# Patient Record
Sex: Male | Born: 1990 | Race: White | Hispanic: No | Marital: Single
Health system: Southern US, Community
[De-identification: ages and names within clinical notes are randomized; demographics above are authoritative.]

## PROBLEM LIST (undated history)

## (undated) DIAGNOSIS — M109 Gout, unspecified: Secondary | ICD-10-CM

---
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
## 2016-02-02 DIAGNOSIS — G4726 Circadian rhythm sleep disorder, shift work type: Secondary | ICD-10-CM | POA: Diagnosis not present

## 2016-02-02 DIAGNOSIS — F419 Anxiety disorder, unspecified: Secondary | ICD-10-CM | POA: Diagnosis not present

## 2016-02-02 DIAGNOSIS — R040 Epistaxis: Secondary | ICD-10-CM | POA: Diagnosis not present

## 2016-02-20 DIAGNOSIS — K080 Exfoliation of teeth due to systemic causes: Secondary | ICD-10-CM | POA: Diagnosis not present

## 2017-01-23 DIAGNOSIS — G43909 Migraine, unspecified, not intractable, without status migrainosus: Secondary | ICD-10-CM | POA: Diagnosis not present

## 2017-01-23 DIAGNOSIS — G4726 Circadian rhythm sleep disorder, shift work type: Secondary | ICD-10-CM | POA: Diagnosis not present

## 2017-01-27 ENCOUNTER — Emergency Department (HOSPITAL_COMMUNITY)

## 2017-01-27 ENCOUNTER — Emergency Department (HOSPITAL_COMMUNITY)
Admission: EM | Admit: 2017-01-27 | Discharge: 2017-01-28 | Disposition: A | Discharge status: Home / Self Care | Attending: Emergency Medicine | Admitting: Emergency Medicine

## 2017-01-27 ENCOUNTER — Encounter (HOSPITAL_COMMUNITY): Payer: Self-pay | Admitting: Emergency Medicine

## 2017-01-27 DIAGNOSIS — W231XXA Caught, crushed, jammed, or pinched between stationary objects, initial encounter: Secondary | ICD-10-CM | POA: Diagnosis not present

## 2017-01-27 DIAGNOSIS — Y990 Civilian activity done for income or pay: Secondary | ICD-10-CM | POA: Diagnosis not present

## 2017-01-27 DIAGNOSIS — S8391XA Sprain of unspecified site of right knee, initial encounter: Secondary | ICD-10-CM | POA: Diagnosis not present

## 2017-01-27 DIAGNOSIS — Z7722 Contact with and (suspected) exposure to environmental tobacco smoke (acute) (chronic): Secondary | ICD-10-CM | POA: Diagnosis not present

## 2017-01-27 DIAGNOSIS — Z79899 Other long term (current) drug therapy: Secondary | ICD-10-CM | POA: Diagnosis not present

## 2017-01-27 DIAGNOSIS — Y939 Activity, unspecified: Secondary | ICD-10-CM | POA: Diagnosis not present

## 2017-01-27 DIAGNOSIS — Y92242 Post office as the place of occurrence of the external cause: Secondary | ICD-10-CM | POA: Diagnosis not present

## 2017-01-27 DIAGNOSIS — S8991XA Unspecified injury of right lower leg, initial encounter: Secondary | ICD-10-CM | POA: Diagnosis present

## 2017-01-27 DIAGNOSIS — S8011XA Contusion of right lower leg, initial encounter: Secondary | ICD-10-CM

## 2017-01-27 DIAGNOSIS — M79606 Pain in leg, unspecified: Secondary | ICD-10-CM

## 2017-01-27 MED ORDER — HYDROCODONE-ACETAMINOPHEN 5-325 MG PO TABS
1.0000 | ORAL_TABLET | Freq: Once | ORAL | Status: AC
Start: 2017-01-27 — End: 2017-01-27
  Administered 2017-01-27: 1 via ORAL
  Filled 2017-01-27: qty 1

## 2017-01-27 NOTE — ED Triage Notes (Signed)
Per [facility] pt picked up from work at [employer] due to  mail cart pinned pts right leg against pole right. Bruising noted to right knee, no obvious deformity. 100mcg fent given en route. 20G LFA.   120 HR 130/84 BP

## 2017-01-27 NOTE — ED Provider Notes (Signed)
[facility] Provider Note   CSN: [id number] Arrival date & time: 01/27/17  2100     History   Chief Complaint Chief Complaint  Patient presents with  . Leg Injury    HPI Kenneth Livingston is a 26 y.o. male brought in by EMS who presents with right knee pain and swelling that began today at at 7:30 PM. Patient states he has not work when his leg was pinned between a pole and a heavy mail box has been mute with a forklift. Patient states that for me was moving the mail box on a mover and did not see him causing his leg to be pinned against the pole. He states it did not fall on his leg. Portably pain after the incident. He states that he has not been able in good repair weight on that leg since incident. Patient was brought into the ED by EMS 100 mcg of fentanyl en route. He denies any pre-existing issue with the right knee. Denies any numbness/weakness of the right leg. He denies any ankle pain.  The history is provided by the patient.    History reviewed. No pertinent past medical history.  There are no active problems to display for this patient.   History reviewed. No pertinent surgical history.     Home Medications    Prior to Admission medications   Medication Sig Start Date End Date Taking? Authorizing Provider  ibuprofen (ADVIL,MOTRIN) 200 MG tablet Take 400 mg by mouth every 6 (six) hours as needed for headache, mild pain or moderate pain.   Yes [provider]    Family History No family history on file.  Social History Social History  Substance Use Topics  . Smoking status: Passive Smoke Exposure - Never Smoker  . Smokeless tobacco: Not on file  . Alcohol use Yes     Allergies   Biaxin [clarithromycin]   Review of Systems Review of Systems  Musculoskeletal: Positive for arthralgias (right knee) and joint swelling (right knee).  Neurological: Negative for weakness and numbness.     Physical Exam Updated Vital Signs BP 126/84   Pulse 81    Temp 98.7 F (37.1 C) (Oral)   Resp 15   Ht 6\' 1"  (1.854 m)   Wt 122.5 kg (270 lb)   SpO2 98%   BMI 35.62 kg/m   Physical Exam  Constitutional: He appears well-developed and well-nourished.  Sitting on examination table and appears uncomfortable but no acute distress.  HENT:  Head: Normocephalic and atraumatic.  Eyes: Conjunctivae and EOM are normal. Right eye exhibits no discharge. Left eye exhibits no discharge. No scleral icterus.  Cardiovascular:  Pulses:      Radial pulses are 2+ on the right side, and 2+ on the left side.  Pulmonary/Chest: Effort normal.  Musculoskeletal: He exhibits no deformity.  Mild soft tissue swelling overlying the anterior aspect of the right knee with small overlying area of ecchymosis. Tenderness to palpation to the anterolateral aspect of the right knee. No deformity or crepitus noted. Pain with flexion and extension of the right knee. Negative anterior and posterior to her chest. No instability on varus or valgus stress. Tenderness palpation to the lateral aspect of the distal femur on the right side. Full range of motion of right ankle. Dorsiflexion and plantarflexion of right ankle intact. Left lower extremity with no abnormalities.  Neurological: He is alert.  Skin: Skin is warm and dry.  Psychiatric: He has a normal mood and affect. His speech is  normal and behavior is normal.  Nursing note and vitals reviewed.    ED Treatments / Results  Labs (all labs ordered are listed, but only abnormal results are displayed) Labs Reviewed  CBC WITH DIFFERENTIAL/PLATELET  CK  COMPREHENSIVE METABOLIC PANEL    EKG  EKG Interpretation None       Radiology Dg Knee Complete 4 Views Right  Result Date: 01/27/2017 CLINICAL DATA:  Right sub patellar pain with swelling and bruising EXAM: RIGHT KNEE - COMPLETE 4+ VIEW COMPARISON:  None. FINDINGS: No evidence of fracture, dislocation, or joint effusion. No evidence of arthropathy or other focal bone  abnormality. Soft tissues are unremarkable. IMPRESSION: Negative. Electronically Signed   By: Jasmine Pang M.D.   On: 01/27/2017 22:09   Dg Femur, Min 2 Views Right  Result Date: 01/28/2017 CLINICAL DATA:  Right sub patellar pain, swelling, and bruising. Pain to the right thigh. Leg was trapped between a truck and a mail container at work 1 hour ago. EXAM: RIGHT FEMUR 2 VIEWS COMPARISON:  None. FINDINGS: Small focal circumscribed lucency in the right femoral neck probably representing a benign bone cyst. No evidence of acute fracture or dislocation of the right femur. No destructive or expansile bone lesions. Soft tissues are unremarkable. IMPRESSION: Benign-appearing cystic lesion in the femoral neck. No evidence of acute fracture or dislocation. Electronically Signed   By: Burman Nieves M.D.   On: 01/28/2017 00:15    Procedures Procedures (including critical care time)  Medications Ordered in ED Medications  HYDROcodone-acetaminophen (NORCO/VICODIN) 5-325 MG per tablet 1 tablet (1 tablet Oral Given 01/27/17 2259)  morphine 4 MG/ML injection 4 mg (4 mg Intravenous Given 01/28/17 0112)  sodium chloride 0.9 % bolus 1,000 mL (1,000 mLs Intravenous New Bag/Given 01/28/17 0136)     Initial Impression / Assessment and Plan / ED Course  I have reviewed the triage vital signs and the nursing notes.  Pertinent labs & imaging results that were available during my care of the patient were reviewed by me and considered in my medical decision making (see chart for details).     26 year old male who presents with right knee pain that began this evening after it was pain between a pole and a metal mailbox. Patient was initially given 100 mcg of fentanyl IM en route. On ED arrival he is still complaining of significant pain to the right knee and right femur area. Consider fracture versus dislocation. X-rays ordered at triage.  X-ray reviewed. Negative for any acute fracture or dislocation. Patient  states that he is still having significant pain and does not feel like he can ambulate or bear weight on the leg. Additional pain medications ordered. Discussed patient with Dr. Silverio Lay. Given mechanism of injury concern for crush injury or acute fracture of the right femur given patient's complaint of pain and inability to bear weight. Will plan to order additional femur x-ray to evaluate for any acute fracture. Will also plan to CT right femur with contrast to evaluate for any acute abnormality. Will order basic labs including CBC, BMP, CK to evaluate for any potential crush injury and hyperkalemia mechanism of injury.  X-ray reviewed. Negative for any acute femur fracture. Patient still in significant pain. Additional pain medications ordered. CT and labs still pending.  Patient signed out to Terance Hart, PA-C CT and labs pending. She is aware of plan and pending results. Will plan to display no patient with orthopedic follow-up if workup is negative.    Final Clinical Impressions(s) /  ED Diagnoses   Final diagnoses:  Sprain of right knee, unspecified ligament, initial encounter  Contusion of right lower extremity, initial encounter    New Prescriptions New Prescriptions   No medications on file     Rosana Hoes 01/28/17 0204    Charlynne Pander, MD 01/28/17 1332

## 2017-01-28 ENCOUNTER — Encounter (HOSPITAL_COMMUNITY): Payer: Self-pay

## 2017-01-28 ENCOUNTER — Emergency Department (HOSPITAL_COMMUNITY)

## 2017-01-28 LAB — CBC WITH DIFFERENTIAL/PLATELET
Basophils Absolute: 0 10*3/uL (ref 0.0–0.1)
Basophils Relative: 1 %
EOS ABS: 0.1 10*3/uL (ref 0.0–0.7)
EOS PCT: 1 %
HCT: 41.2 % (ref 39.0–52.0)
Hemoglobin: 14.2 g/dL (ref 13.0–17.0)
LYMPHS ABS: 2.6 10*3/uL (ref 0.7–4.0)
Lymphocytes Relative: 35 %
MCH: 30.4 pg (ref 26.0–34.0)
MCHC: 34.5 g/dL (ref 30.0–36.0)
MCV: 88.2 fL (ref 78.0–100.0)
Monocytes Absolute: 0.3 10*3/uL (ref 0.1–1.0)
Monocytes Relative: 4 %
Neutro Abs: 4.3 10*3/uL (ref 1.7–7.7)
Neutrophils Relative %: 59 %
PLATELETS: 206 10*3/uL (ref 150–400)
RBC: 4.67 MIL/uL (ref 4.22–5.81)
RDW: 12.6 % (ref 11.5–15.5)
WBC: 7.2 10*3/uL (ref 4.0–10.5)

## 2017-01-28 LAB — COMPREHENSIVE METABOLIC PANEL
ALT: 94 U/L — ABNORMAL HIGH (ref 17–63)
ANION GAP: 12 (ref 5–15)
AST: 51 U/L — AB (ref 15–41)
Albumin: 4.1 g/dL (ref 3.5–5.0)
Alkaline Phosphatase: 86 U/L (ref 38–126)
BUN: 19 mg/dL (ref 6–20)
CHLORIDE: 105 mmol/L (ref 101–111)
CO2: 22 mmol/L (ref 22–32)
Calcium: 9.1 mg/dL (ref 8.9–10.3)
Creatinine, Ser: 1.03 mg/dL (ref 0.61–1.24)
Glucose, Bld: 91 mg/dL (ref 65–99)
POTASSIUM: 3.8 mmol/L (ref 3.5–5.1)
Sodium: 139 mmol/L (ref 135–145)
Total Bilirubin: 0.5 mg/dL (ref 0.3–1.2)
Total Protein: 7.5 g/dL (ref 6.5–8.1)

## 2017-01-28 LAB — CK: CK TOTAL: 188 U/L (ref 49–397)

## 2017-01-28 MED ORDER — MORPHINE SULFATE (PF) 4 MG/ML IV SOLN
4.0000 mg | Freq: Once | INTRAVENOUS | Status: AC
  Administered 2017-01-28: 4 mg via INTRAVENOUS
  Filled 2017-01-28: qty 1

## 2017-01-28 MED ORDER — SODIUM CHLORIDE 0.9 % IV BOLUS (SEPSIS)
1000.0000 mL | Freq: Once | INTRAVENOUS | Status: AC
  Administered 2017-01-28: 1000 mL via INTRAVENOUS

## 2017-01-28 MED ORDER — HYDROCODONE-ACETAMINOPHEN 5-325 MG PO TABS
1.0000 | ORAL_TABLET | Freq: Once | ORAL | Status: AC
Start: 2017-01-28 — End: 2017-01-28
  Administered 2017-01-28: 1 via ORAL
  Filled 2017-01-28: qty 1

## 2017-01-28 MED ORDER — ONDANSETRON HCL 4 MG/2ML IJ SOLN
4.0000 mg | Freq: Once | INTRAMUSCULAR | Status: AC
  Administered 2017-01-28: 4 mg via INTRAVENOUS
  Filled 2017-01-28: qty 2

## 2017-01-28 MED ORDER — HYDROCODONE-ACETAMINOPHEN 5-325 MG PO TABS: 1.0000 | ORAL_TABLET | Freq: Four times a day (QID) | ORAL | 0 refills | Status: AC | PRN

## 2017-01-28 MED ORDER — ONDANSETRON 8 MG PO TBDP: 8.0000 mg | ORAL_TABLET | Freq: Once | ORAL | Status: DC

## 2017-01-28 MED ORDER — IOPAMIDOL (ISOVUE-300) INJECTION 61%
100.0000 mL | Freq: Once | INTRAVENOUS | Status: AC | PRN
  Administered 2017-01-28: 100 mL via INTRAVENOUS

## 2017-01-28 MED ORDER — IOPAMIDOL (ISOVUE-300) INJECTION 61%
INTRAVENOUS | Status: AC
Start: 2017-01-28 — End: 2017-01-28
  Administered 2017-01-28: 100 mL via INTRAVENOUS
  Filled 2017-01-28: qty 100

## 2017-01-28 NOTE — ED Notes (Signed)
Pt transported to CT ?

## 2017-01-28 NOTE — ED Notes (Signed)
Patient is alert and oriented x3.  He was given DC instructions and follow up visit instructions.  Patient gave verbal understanding.  He was DC ambulatory under his own power to home.  V/S stable.  He was not showing any signs of distress on DC 

## 2017-01-28 NOTE — Discharge Instructions (Signed)
Use a knee immobilizer for support and stability. Use the crutches to help with mobilization. Make sure you are icing and elevating the leg as directed.  Take pain medication as directed.  Take Tylenol or ibuprofen as needed for pain.  Follow-up with the referred orthopedic doctor in the next 24-48 hours for further evaluation.  Follow-up with her primary care doctor in the next 24-48 hours for further evaluation.  Return to the emergency department for any worsening or severe pain, worsening swelling/redness of the knee, redness that extends down the leg, numbness/weakness of the right lower leg, fever or any other worsening or concerning symptoms.

## 2017-01-28 NOTE — ED Provider Notes (Signed)
Pt signed out to me by Graciella FreerLindsey Layden PA-C at shift change. He is a 26 year old male with R leg pain after incident at work today. Xray of femur and knee are unremarkable. Plan is to obtain CT w contrast due to pain out of proportion and labs.   Labs remarkable for mild elevation of transaminases. CT negative. Discussed with patient and family. Will place in knee immobilizer and provide crutches. Paperwork filled out per family request. Advised follow up with PCP or Ortho    Bethel BornGekas, Josha Weekley Marie, PA-C 01/28/17 1255    Palumbo, April, MD 01/30/17 2302

## 2017-01-30 ENCOUNTER — Ambulatory Visit (HOSPITAL_COMMUNITY)
Admission: EM | Admit: 2017-01-30 | Discharge: 2017-01-30 | Disposition: A | Discharge status: Home / Self Care | Attending: Internal Medicine | Admitting: Internal Medicine

## 2017-01-30 ENCOUNTER — Encounter (HOSPITAL_COMMUNITY): Payer: Self-pay | Admitting: Emergency Medicine

## 2017-01-30 DIAGNOSIS — S8391XA Sprain of unspecified site of right knee, initial encounter: Secondary | ICD-10-CM

## 2017-01-30 DIAGNOSIS — S8011XA Contusion of right lower leg, initial encounter: Secondary | ICD-10-CM | POA: Diagnosis not present

## 2017-01-30 DIAGNOSIS — M79604 Pain in right leg: Secondary | ICD-10-CM

## 2017-01-30 MED ORDER — NAPROXEN 500 MG PO TABS: 500.0000 mg | ORAL_TABLET | Freq: Two times a day (BID) | ORAL | 0 refills | Status: AC

## 2017-01-30 NOTE — ED Provider Notes (Signed)
[facility]    CSN: [id number] Arrival date & time: 01/30/17  1711     History   Chief Complaint Chief Complaint  Patient presents with  . Leg Pain    HPI Nagi Furio is a 26 y.o. male. He works for the [employer] and had a crush injury of the right knee on May 21. He was pinned between a pole and a cart, when a fork truck drove by. He was evaluated in the ED, and had plain films, CT scan, and labs, all of which were reassuring. He was placed in a knee immobilizer with crutches, with a note for work for 3 days. He was instructed to follow-up with his PCP, who directed him to the urgent care tonight.  He reports that knee is still quite painful.     HPI  History reviewed. No pertinent past medical history.  History reviewed. No pertinent surgical history.     Home Medications    Prior to Admission medications   Medication Sig Start Date End Date Taking? Authorizing Provider  HYDROcodone-acetaminophen (NORCO/VICODIN) 5-325 MG tablet Take 1 tablet by mouth every 6 (six) hours as needed for severe pain. 01/28/17   Bethel Born, PA-C  ibuprofen (ADVIL,MOTRIN) 200 MG tablet Take 400 mg by mouth every 6 (six) hours as needed for headache, mild pain or moderate pain.    [provider]  naproxen (NAPROSYN) 500 MG tablet Take 1 tablet (500 mg total) by mouth 2 (two) times daily. 01/30/17   Eustace Moore, MD    Family History No family history on file.  Social History Social History  Substance Use Topics  . Smoking status: Passive Smoke Exposure - Never Smoker  . Smokeless tobacco: Not on file  . Alcohol use Yes     Allergies   Biaxin [clarithromycin]   Review of Systems Review of Systems  All other systems reviewed and are negative.    Physical Exam Triage Vital Signs ED Triage Vitals  Enc Vitals Group     BP 01/30/17 1756 (!) 147/95     Pulse Rate 01/30/17 1756 97     Resp 01/30/17 1756 16     Temp 01/30/17 1756 98.1 F (36.7 C)     Temp Source 01/30/17 1756 Oral     SpO2 01/30/17 1756 97 %     Weight --      Height --      Pain Score 01/30/17 1758 5     Pain Loc --    Updated Vital Signs BP (!) 147/95 (BP Location: Right Arm) Comment: notified rn  Pulse 97   Temp 98.1 F (36.7 C) (Oral)   Resp 16   SpO2 97%   Physical Exam  Constitutional: He is oriented to person, place, and time. No distress.  Alert, nicely groomed  HENT:  Head: Atraumatic.  Eyes:  Conjugate gaze, no eye redness/drainage  Neck: Neck supple.  Cardiovascular: Normal rate.   Pulmonary/Chest: No respiratory distress.  Abdominal: He exhibits no distension.  Musculoskeletal: Normal range of motion.  Right knee is mildly swollen, not bruised, not red/warm.  Skin intact.  Diffusely tender to palpation, patient states medial aspect of knee is most tender.    Neurological: He is alert and oriented to person, place, and time.  Skin: Skin is warm and dry.  No cyanosis  Nursing note and vitals reviewed.    UC Treatments / Results   Procedures Procedures (including critical care time) None today  Final Clinical Impressions(s) / UC Diagnoses   Final diagnoses:  Contusion of right lower leg, initial encounter  Right leg pain  Sprain of right knee, unspecified ligament, initial encounter   Anticipate gradual improvement in knee pain/swelling after crush-type injury 5/21.  Xrays, CT scan and labs in ED on 5/21 were reassuring.  Wear knee immobilizer for comfort and use crutches as needed.  Weight bear as tolerated.  Ice/elevate for 5-10 minutes several times daily to help with swelling and discomfort.  Prescription for naproxen (anti inflammatory/pain reliever sent to the [facility] on [address].  Ok to return to work at [employer]:  no use of right leg.  [facility]    New Prescriptions New Prescriptions   NAPROXEN (NAPROSYN) 500 MG TABLET    Take 1 tablet (500 mg total) by mouth 2 (two) times daily.       Eustace MooreMurray, Lenzie Sandler W, MD 01/31/17 [phone]

## 2017-01-30 NOTE — Discharge Instructions (Addendum)
Anticipate gradual improvement in knee pain/swelling after crush-type injury 5/21.  Xrays, CT scan and labs in ED on 5/21 were reassuring.  Wear knee immobilizer for comfort and use crutches as needed.  Weight bear as tolerated.  Ice/elevate for 5-10 minutes several times daily to help with swelling and discomfort.  Prescription for naproxen (anti inflammatory/pain reliever sent to the [facility] on [address]  Ok to return to work at [employer]:  no use of right leg.  [facility]

## 2017-01-30 NOTE — ED Triage Notes (Signed)
Continued leg pain, requesting work note, physician referral  Ed told to go to pcp, pcp told patient to come to [facility]

## 2017-01-30 NOTE — ED Notes (Signed)
Upon arrival noticed pt .'s knee immoblizer was applied incorrectly, and crutch not adjusted to the right height. Fixed the knee immoblizer and crutch. Pt stated  that the knee felt much more stable.Advised Dr. Dayton ScrapeMurray of same

## 2017-02-03 ENCOUNTER — Encounter (HOSPITAL_COMMUNITY): Payer: Self-pay | Admitting: Emergency Medicine

## 2017-02-03 ENCOUNTER — Ambulatory Visit (HOSPITAL_COMMUNITY)
Admission: EM | Admit: 2017-02-03 | Discharge: 2017-02-03 | Disposition: A | Discharge status: Home / Self Care | Attending: Family Medicine | Admitting: Family Medicine

## 2017-02-03 DIAGNOSIS — Z09 Encounter for follow-up examination after completed treatment for conditions other than malignant neoplasm: Secondary | ICD-10-CM

## 2017-02-03 DIAGNOSIS — M79604 Pain in right leg: Secondary | ICD-10-CM

## 2017-02-03 NOTE — Discharge Instructions (Signed)
You will need to see workers comp tomorrow to discuss options of ortho to see.  Will need to see occupational health to have continues note for days off work. You are able to be on light duty.  We are not able to give you time out for another week but will extend the days due to the holiday.

## 2017-02-03 NOTE — ED Triage Notes (Signed)
Needs a work note 

## 2017-02-03 NOTE — ED Provider Notes (Signed)
CSN: [id number]     Arrival date & time 02/03/17  1842 History   First MD Initiated Contact with Patient 02/03/17 1933     Chief Complaint  Patient presents with  . Letter for School/Work   (Consider location/radiation/quality/duration/timing/severity/associated sxs/prior Treatment) Pt is here for a work note. He was seen last week for an injury at work. Was educated to see occupational health for evaluation and to follow up with work comp to see ortho. Pt has not done this states the office was closed. Pt wants to have a note to be out for another week. States that the pain is better to rt leg that he is using  The immobilizer and crutches.       History reviewed. No pertinent past medical history. History reviewed. No pertinent surgical history. No family history on file. Social History  Substance Use Topics  . Smoking status: Passive Smoke Exposure - Never Smoker  . Smokeless tobacco: Not on file  . Alcohol use Yes    Review of Systems  Constitutional: Negative.   Respiratory: Negative.   Cardiovascular: Negative.   Musculoskeletal: Positive for joint swelling.       Rt leg soreness, minimal flexion.   Neurological: Negative.     Allergies  Biaxin [clarithromycin]  Home Medications   Prior to Admission medications   Medication Sig Start Date End Date Taking? Authorizing Provider  HYDROcodone-acetaminophen (NORCO/VICODIN) 5-325 MG tablet Take 1 tablet by mouth every 6 (six) hours as needed for severe pain. 01/28/17   Bethel BornGekas, Kelly Marie, PA-C  ibuprofen (ADVIL,MOTRIN) 200 MG tablet Take 400 mg by mouth every 6 (six) hours as needed for headache, mild pain or moderate pain.    [provider]  naproxen (NAPROSYN) 500 MG tablet Take 1 tablet (500 mg total) by mouth 2 (two) times daily. 01/30/17   Eustace MooreMurray, Laura W, MD   Meds Ordered and Administered this Visit  Medications - No data to display  BP 124/86 (BP Location: Right Arm) Comment: large cuff  Pulse (!) 103    Temp 99.2 F (37.3 C) (Oral)   Resp 18   SpO2 100%  No data found.   Physical Exam  Constitutional: He appears well-developed.  Cardiovascular: Normal rate.   Pulmonary/Chest: Effort normal.  Abdominal: Soft.  Musculoskeletal: He exhibits tenderness.  Rt leg, pain with flexion, strong pulses, warm   Neurological: He is alert.  Skin: Skin is warm. Capillary refill takes less than 2 seconds.    Urgent Care Course     Procedures (including critical care time)  Labs Review Labs Reviewed - No data to display  Imaging Review No results found.        MDM   1. Follow-up exam    You will need to see workers comp tomorrow to discuss options of ortho to see.  Will need to see occupational health to have continues note for days off work. You are able to be on light duty.  We are not able to give you time out for another week but will extend the days due to the holiday.     Tobi BastosMitchell, Anthonella Klausner A, NP 02/03/17 [phone]

## 2017-02-19 DIAGNOSIS — R748 Abnormal levels of other serum enzymes: Secondary | ICD-10-CM | POA: Diagnosis not present

## 2017-02-19 DIAGNOSIS — H6693 Otitis media, unspecified, bilateral: Secondary | ICD-10-CM | POA: Diagnosis not present

## 2017-02-19 DIAGNOSIS — G4726 Circadian rhythm sleep disorder, shift work type: Secondary | ICD-10-CM | POA: Diagnosis not present

## 2017-02-19 DIAGNOSIS — G43909 Migraine, unspecified, not intractable, without status migrainosus: Secondary | ICD-10-CM | POA: Diagnosis not present

## 2018-02-01 IMAGING — CT CT FEMUR *R* WO/W CM
4 of 6 series · 11 of 34 positions shown, 13 images · non-contrast
Comparison: Right knee radiographs performed 01/27/2017

CONTRAST:  100 mL of Isovue 300 IV contrast

CLINICAL DATA: Pain and swelling at the right thigh after being
pinned between a pole and mailbox, acute onset. Initial encounter.

EXAM:
CT OF THE LOWER RIGHT EXTREMITY WITH AND WITHOUT CONTRAST
TECHNIQUE: Multidetector CT imaging of the lower right extremity was performed
following the standard protocol before and during bolus
administration of intravenous contrast.

[Series 7: coronal st · coronal · 0.47mm/px · 1 of 140 slices shown]
[im 70/140  bone]
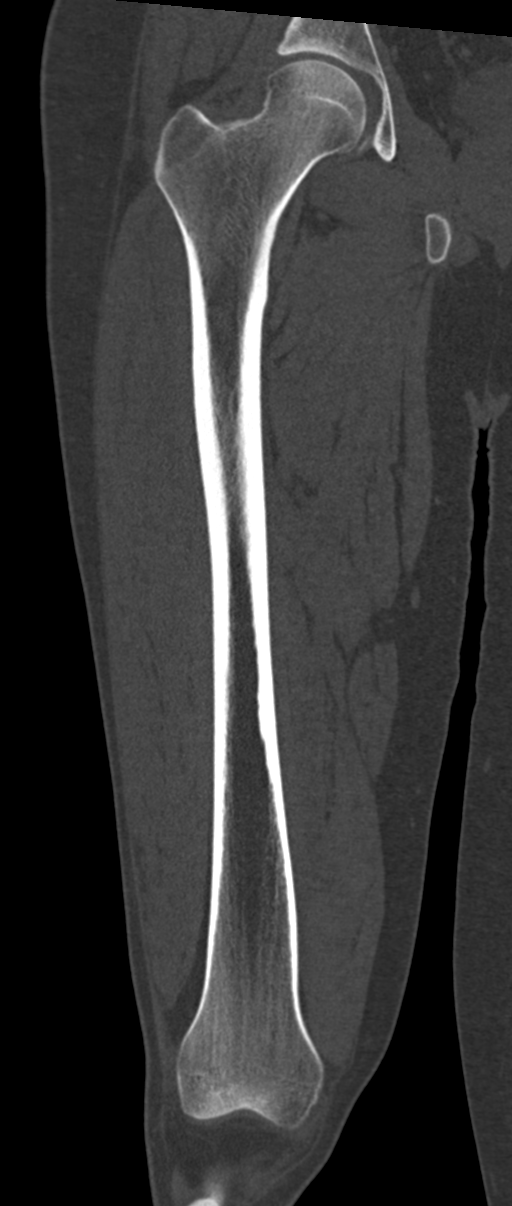

[Series 8: sagittal st · sagittal · 0.45mm/px · 3 of 146 slices shown]
[im 84/146  bone]
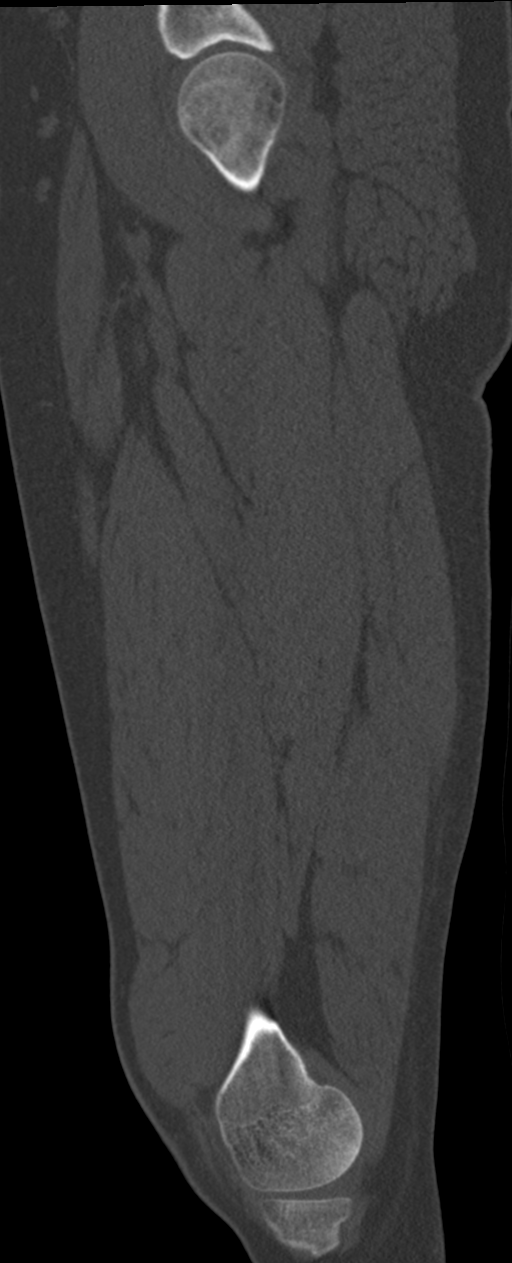
[im 104/146  bone]
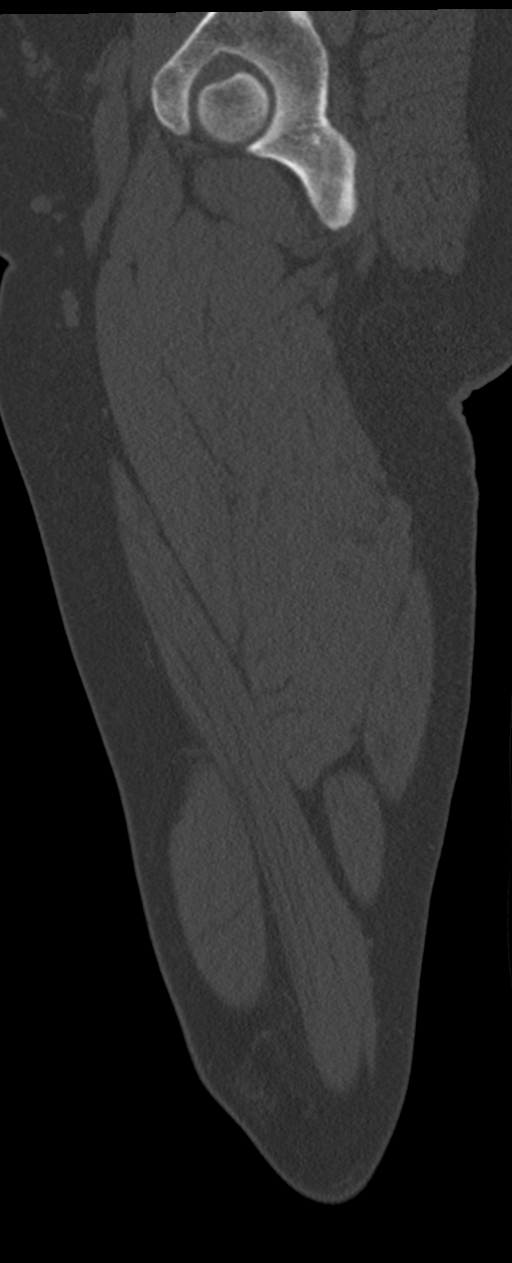
[im 125/146  bone]
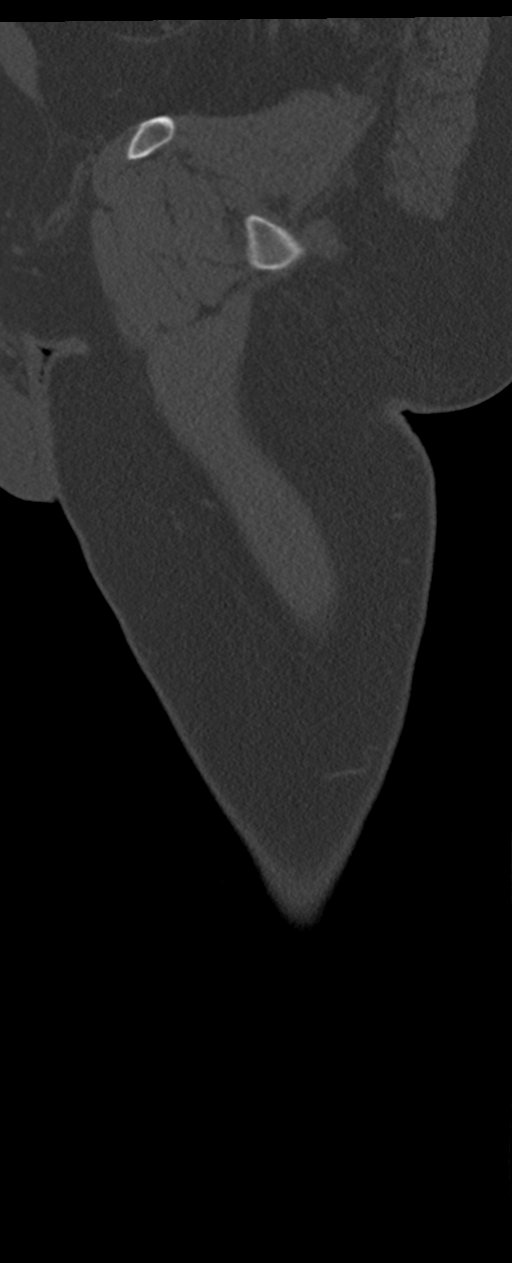

[Series 9: axial st · axial · 0.49mm/px · z∈[+824,+1203]mm · 5 of 381 slices shown, 7 images (1 of 2)]
[im 64/381  soft-tissue]
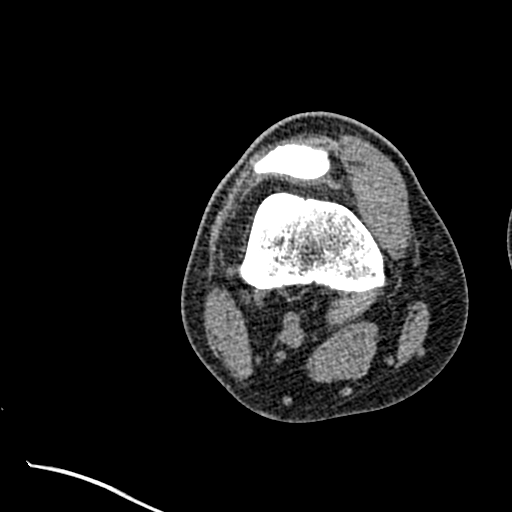
[im 64/381  bone]
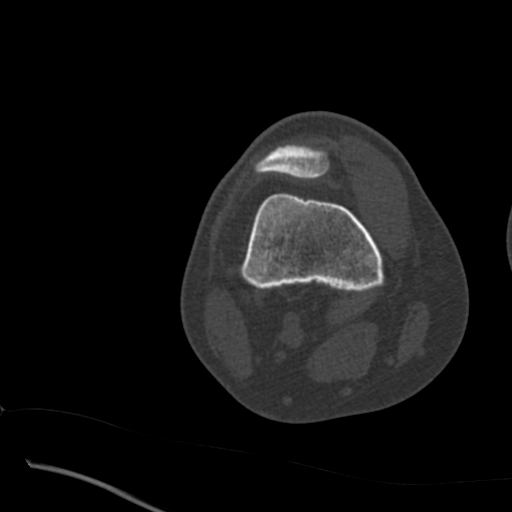
[im 127/381  bone]
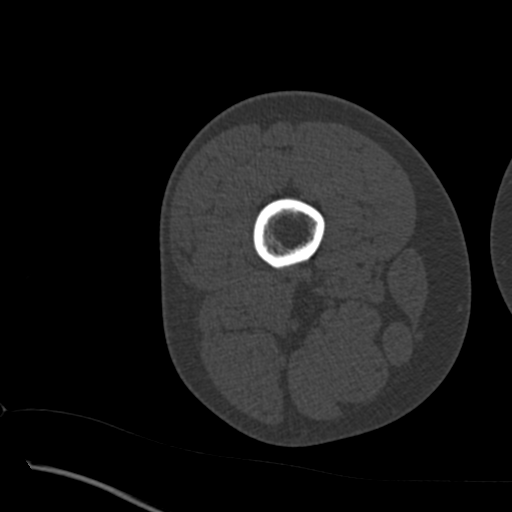
[im 191/381  bone]
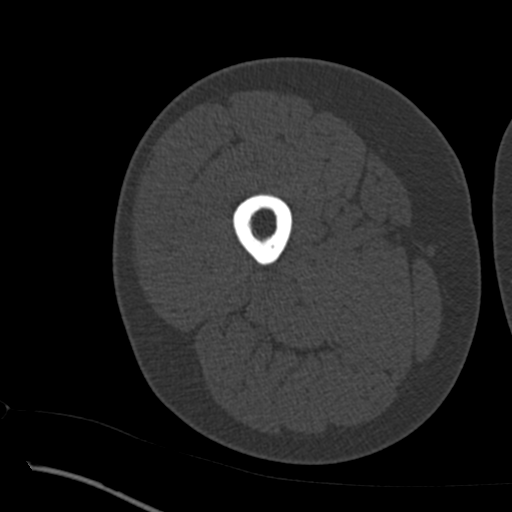
[im 254/381  bone]
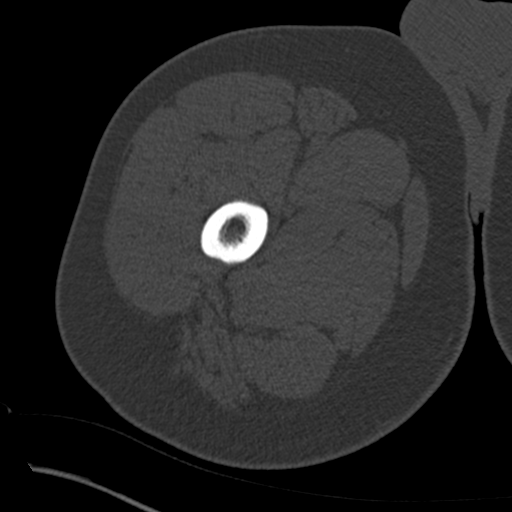
[im 317/381  soft-tissue]
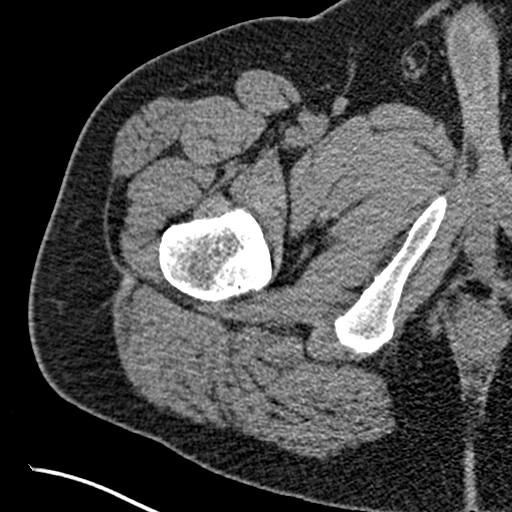
[im 317/381  bone]
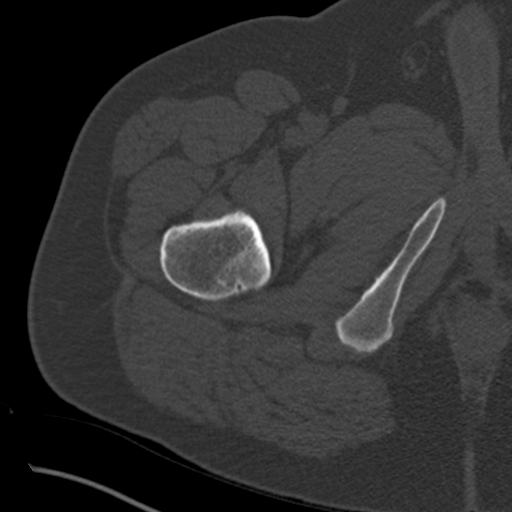

[Series 14: axial st · axial · 0.50mm/px · z∈[+886,+982]mm · 2 of 383 slices shown (2 of 2)]
[im 64/383  bone]
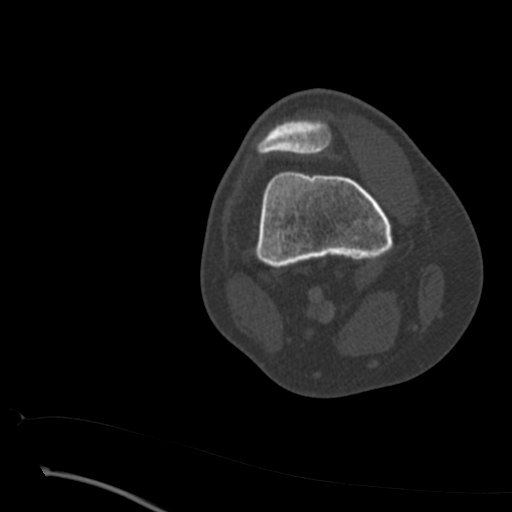
[im 128/383  bone]
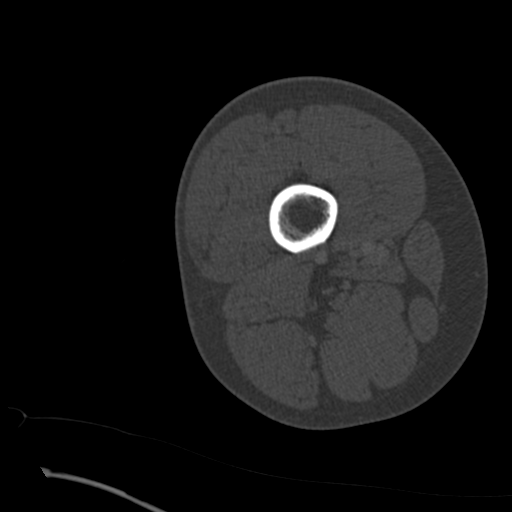

[11 of 34 positions shown; findings below may reference images not displayed]

FINDINGS: Bones/Joint/Cartilage

There is no evidence of fracture or dislocation. No knee joint
effusion is identified. The cartilage is not well assessed on CT.

A small herniation pit is noted along the anterior aspect of the
right femoral neck.

Ligaments

Suboptimally assessed by CT.

Muscles and Tendons

The visualized musculature and tendon structures are grossly
unremarkable.

Soft tissues

Mild soft tissue injury is noted along the lateral aspect of the
right mid thigh, and along the medial aspect of the right knee.

The vasculature is grossly unremarkable in appearance on
postcontrast images. The bladder is mildly distended and grossly
unremarkable. Minimal calcification is noted at the prostate.
IMPRESSION: 1. No evidence of fracture or dislocation.
2. Mild soft tissue injury along the lateral aspect of the right mid
thigh, and along the medial aspect of the right knee.
3. Vasculature is unremarkable in appearance.
4. Small herniation pit incidentally noted along the anterior aspect
of the right femoral neck.

## 2018-06-11 DIAGNOSIS — G4726 Circadian rhythm sleep disorder, shift work type: Secondary | ICD-10-CM | POA: Diagnosis not present

## 2018-06-11 DIAGNOSIS — G43701 Chronic migraine without aura, not intractable, with status migrainosus: Secondary | ICD-10-CM | POA: Diagnosis not present

## 2019-03-08 DIAGNOSIS — M79672 Pain in left foot: Secondary | ICD-10-CM | POA: Diagnosis not present

## 2019-07-30 ENCOUNTER — Other Ambulatory Visit: Payer: Self-pay

## 2019-07-30 DIAGNOSIS — Z20822 Contact with and (suspected) exposure to covid-19: Secondary | ICD-10-CM

## 2019-08-02 LAB — NOVEL CORONAVIRUS, NAA: SARS-CoV-2, NAA: NOT DETECTED

## 2020-03-17 DIAGNOSIS — Z136 Encounter for screening for cardiovascular disorders: Secondary | ICD-10-CM | POA: Diagnosis not present

## 2020-03-17 DIAGNOSIS — L989 Disorder of the skin and subcutaneous tissue, unspecified: Secondary | ICD-10-CM | POA: Diagnosis not present

## 2020-03-17 DIAGNOSIS — Z131 Encounter for screening for diabetes mellitus: Secondary | ICD-10-CM | POA: Diagnosis not present

## 2020-03-17 DIAGNOSIS — Z0000 Encounter for general adult medical examination without abnormal findings: Secondary | ICD-10-CM | POA: Diagnosis not present

## 2020-03-17 DIAGNOSIS — Z23 Encounter for immunization: Secondary | ICD-10-CM | POA: Diagnosis not present

## 2020-04-21 DIAGNOSIS — G43909 Migraine, unspecified, not intractable, without status migrainosus: Secondary | ICD-10-CM | POA: Diagnosis not present

## 2020-08-28 DIAGNOSIS — J019 Acute sinusitis, unspecified: Secondary | ICD-10-CM | POA: Diagnosis not present

## 2020-08-28 DIAGNOSIS — U071 COVID-19: Secondary | ICD-10-CM | POA: Diagnosis not present

## 2020-09-01 DIAGNOSIS — J019 Acute sinusitis, unspecified: Secondary | ICD-10-CM | POA: Diagnosis not present

## 2020-09-01 DIAGNOSIS — Z03818 Encounter for observation for suspected exposure to other biological agents ruled out: Secondary | ICD-10-CM | POA: Diagnosis not present

## 2020-09-14 DIAGNOSIS — D224 Melanocytic nevi of scalp and neck: Secondary | ICD-10-CM | POA: Diagnosis not present

## 2020-10-05 DIAGNOSIS — M109 Gout, unspecified: Secondary | ICD-10-CM | POA: Diagnosis not present

## 2020-12-21 DIAGNOSIS — H6690 Otitis media, unspecified, unspecified ear: Secondary | ICD-10-CM | POA: Diagnosis not present

## 2020-12-21 DIAGNOSIS — K429 Umbilical hernia without obstruction or gangrene: Secondary | ICD-10-CM | POA: Diagnosis not present

## 2021-02-01 DIAGNOSIS — K429 Umbilical hernia without obstruction or gangrene: Secondary | ICD-10-CM | POA: Diagnosis not present

## 2021-02-07 DIAGNOSIS — Z0000 Encounter for general adult medical examination without abnormal findings: Secondary | ICD-10-CM | POA: Diagnosis not present

## 2021-02-07 DIAGNOSIS — Z0189 Encounter for other specified special examinations: Secondary | ICD-10-CM | POA: Diagnosis not present

## 2021-02-09 DIAGNOSIS — Z131 Encounter for screening for diabetes mellitus: Secondary | ICD-10-CM | POA: Diagnosis not present

## 2021-02-09 DIAGNOSIS — Z8739 Personal history of other diseases of the musculoskeletal system and connective tissue: Secondary | ICD-10-CM | POA: Diagnosis not present

## 2021-02-09 DIAGNOSIS — E782 Mixed hyperlipidemia: Secondary | ICD-10-CM | POA: Diagnosis not present

## 2021-03-30 DIAGNOSIS — K429 Umbilical hernia without obstruction or gangrene: Secondary | ICD-10-CM | POA: Diagnosis not present

## 2021-11-22 DIAGNOSIS — K080 Exfoliation of teeth due to systemic causes: Secondary | ICD-10-CM | POA: Diagnosis not present

## 2021-11-28 DIAGNOSIS — J019 Acute sinusitis, unspecified: Secondary | ICD-10-CM | POA: Diagnosis not present

## 2022-02-28 DIAGNOSIS — Z131 Encounter for screening for diabetes mellitus: Secondary | ICD-10-CM | POA: Diagnosis not present

## 2022-02-28 DIAGNOSIS — Z0000 Encounter for general adult medical examination without abnormal findings: Secondary | ICD-10-CM | POA: Diagnosis not present

## 2022-02-28 DIAGNOSIS — E782 Mixed hyperlipidemia: Secondary | ICD-10-CM | POA: Diagnosis not present

## 2022-06-03 DIAGNOSIS — L729 Follicular cyst of the skin and subcutaneous tissue, unspecified: Secondary | ICD-10-CM | POA: Diagnosis not present

## 2022-07-18 DIAGNOSIS — G43909 Migraine, unspecified, not intractable, without status migrainosus: Secondary | ICD-10-CM | POA: Diagnosis not present

## 2022-08-13 DIAGNOSIS — L03116 Cellulitis of left lower limb: Secondary | ICD-10-CM | POA: Diagnosis not present

## 2022-08-13 DIAGNOSIS — L02416 Cutaneous abscess of left lower limb: Secondary | ICD-10-CM | POA: Diagnosis not present

## 2022-08-16 DIAGNOSIS — L0291 Cutaneous abscess, unspecified: Secondary | ICD-10-CM | POA: Diagnosis not present

## 2022-08-16 DIAGNOSIS — L02416 Cutaneous abscess of left lower limb: Secondary | ICD-10-CM | POA: Diagnosis not present

## 2022-08-16 DIAGNOSIS — M79671 Pain in right foot: Secondary | ICD-10-CM | POA: Diagnosis not present

## 2022-08-28 DIAGNOSIS — L500 Allergic urticaria: Secondary | ICD-10-CM | POA: Diagnosis not present

## 2022-08-28 DIAGNOSIS — L299 Pruritus, unspecified: Secondary | ICD-10-CM | POA: Diagnosis not present

## 2022-08-28 DIAGNOSIS — R21 Rash and other nonspecific skin eruption: Secondary | ICD-10-CM | POA: Diagnosis not present

## 2022-08-29 DIAGNOSIS — R21 Rash and other nonspecific skin eruption: Secondary | ICD-10-CM | POA: Diagnosis not present

## 2022-08-29 DIAGNOSIS — E782 Mixed hyperlipidemia: Secondary | ICD-10-CM | POA: Diagnosis not present

## 2022-10-04 DIAGNOSIS — R21 Rash and other nonspecific skin eruption: Secondary | ICD-10-CM | POA: Diagnosis not present

## 2023-03-06 DIAGNOSIS — Z131 Encounter for screening for diabetes mellitus: Secondary | ICD-10-CM | POA: Diagnosis not present

## 2023-03-06 DIAGNOSIS — Z0000 Encounter for general adult medical examination without abnormal findings: Secondary | ICD-10-CM | POA: Diagnosis not present

## 2023-03-06 DIAGNOSIS — E782 Mixed hyperlipidemia: Secondary | ICD-10-CM | POA: Diagnosis not present

## 2023-05-23 DIAGNOSIS — T63441A Toxic effect of venom of bees, accidental (unintentional), initial encounter: Secondary | ICD-10-CM | POA: Diagnosis not present

## 2023-05-23 DIAGNOSIS — R600 Localized edema: Secondary | ICD-10-CM | POA: Diagnosis not present

## 2023-08-18 DIAGNOSIS — G43909 Migraine, unspecified, not intractable, without status migrainosus: Secondary | ICD-10-CM | POA: Diagnosis not present

## 2023-11-04 DIAGNOSIS — M10071 Idiopathic gout, right ankle and foot: Secondary | ICD-10-CM | POA: Diagnosis not present

## 2023-11-04 DIAGNOSIS — M79671 Pain in right foot: Secondary | ICD-10-CM | POA: Diagnosis not present

## 2023-11-12 ENCOUNTER — Emergency Department (HOSPITAL_BASED_OUTPATIENT_CLINIC_OR_DEPARTMENT_OTHER)
Admission: EM | Admit: 2023-11-12 | Discharge: 2023-11-12 | Disposition: A | Discharge status: Home / Self Care | Attending: Emergency Medicine | Admitting: Emergency Medicine

## 2023-11-12 ENCOUNTER — Emergency Department (HOSPITAL_BASED_OUTPATIENT_CLINIC_OR_DEPARTMENT_OTHER)

## 2023-11-12 ENCOUNTER — Other Ambulatory Visit: Payer: Self-pay

## 2023-11-12 ENCOUNTER — Other Ambulatory Visit (HOSPITAL_BASED_OUTPATIENT_CLINIC_OR_DEPARTMENT_OTHER): Payer: Self-pay

## 2023-11-12 ENCOUNTER — Encounter (HOSPITAL_BASED_OUTPATIENT_CLINIC_OR_DEPARTMENT_OTHER): Payer: Self-pay | Admitting: Emergency Medicine

## 2023-11-12 DIAGNOSIS — D72819 Decreased white blood cell count, unspecified: Secondary | ICD-10-CM | POA: Insufficient documentation

## 2023-11-12 DIAGNOSIS — R109 Unspecified abdominal pain: Secondary | ICD-10-CM | POA: Diagnosis not present

## 2023-11-12 DIAGNOSIS — M545 Low back pain, unspecified: Secondary | ICD-10-CM | POA: Insufficient documentation

## 2023-11-12 DIAGNOSIS — R0602 Shortness of breath: Secondary | ICD-10-CM | POA: Diagnosis not present

## 2023-11-12 DIAGNOSIS — K4090 Unilateral inguinal hernia, without obstruction or gangrene, not specified as recurrent: Secondary | ICD-10-CM | POA: Diagnosis not present

## 2023-11-12 DIAGNOSIS — K760 Fatty (change of) liver, not elsewhere classified: Secondary | ICD-10-CM | POA: Diagnosis not present

## 2023-11-12 HISTORY — DX: Gout, unspecified: M10.9

## 2023-11-12 LAB — CBC
HCT: 39.2 % (ref 39.0–52.0)
Hemoglobin: 13.9 g/dL (ref 13.0–17.0)
MCH: 30.4 pg (ref 26.0–34.0)
MCHC: 35.5 g/dL (ref 30.0–36.0)
MCV: 85.8 fL (ref 80.0–100.0)
Platelets: 211 10*3/uL (ref 150–400)
RBC: 4.57 MIL/uL (ref 4.22–5.81)
RDW: 12.7 % (ref 11.5–15.5)
WBC: 10.8 10*3/uL — ABNORMAL HIGH (ref 4.0–10.5)
nRBC: 0 % (ref 0.0–0.2)

## 2023-11-12 LAB — URINALYSIS, ROUTINE W REFLEX MICROSCOPIC
Bilirubin Urine: NEGATIVE
Glucose, UA: NEGATIVE mg/dL
Hgb urine dipstick: NEGATIVE
Ketones, ur: NEGATIVE mg/dL
Leukocytes,Ua: NEGATIVE
Nitrite: NEGATIVE
Protein, ur: NEGATIVE mg/dL
Specific Gravity, Urine: 1.021 (ref 1.005–1.030)
pH: 5 (ref 5.0–8.0)

## 2023-11-12 LAB — COMPREHENSIVE METABOLIC PANEL
ALT: 52 U/L — ABNORMAL HIGH (ref 0–44)
AST: 25 U/L (ref 15–41)
Albumin: 4.6 g/dL (ref 3.5–5.0)
Alkaline Phosphatase: 74 U/L (ref 38–126)
Anion gap: 10 (ref 5–15)
BUN: 16 mg/dL (ref 6–20)
CO2: 23 mmol/L (ref 22–32)
Calcium: 8.9 mg/dL (ref 8.9–10.3)
Chloride: 104 mmol/L (ref 98–111)
Creatinine, Ser: 0.93 mg/dL (ref 0.61–1.24)
GFR, Estimated: >60 mL/min (ref >60)
Glucose, Bld: 106 mg/dL — ABNORMAL HIGH (ref 70–99)
Potassium: 4 mmol/L (ref 3.5–5.1)
Sodium: 137 mmol/L (ref 135–145)
Total Bilirubin: 0.5 mg/dL (ref 0.0–1.2)
Total Protein: 7.2 g/dL (ref 6.5–8.1)

## 2023-11-12 LAB — D-DIMER, QUANTITATIVE: D-Dimer, Quant: 0.42 ug{FEU}/mL (ref 0.00–0.50)

## 2023-11-12 LAB — LIPASE, BLOOD: Lipase: 29 U/L (ref 11–51)

## 2023-11-12 MED ORDER — HYDROMORPHONE HCL 1 MG/ML IJ SOLN
1.0000 mg | Freq: Once | INTRAMUSCULAR | Status: AC
  Administered 2023-11-12: 1 mg via INTRAVENOUS
  Filled 2023-11-12: qty 1

## 2023-11-12 MED ORDER — METHOCARBAMOL 500 MG PO TABS
500.0000 mg | ORAL_TABLET | Freq: Four times a day (QID) | ORAL | 0 refills | Status: AC
Start: 2023-11-12 — End: ?
  Filled 2023-11-12: qty 20, 5d supply, fill #0

## 2023-11-12 MED ORDER — DICLOFENAC SODIUM 75 MG PO TBEC
75.0000 mg | DELAYED_RELEASE_TABLET | Freq: Two times a day (BID) | ORAL | 0 refills | Status: AC
  Filled 2023-11-12: qty 14, 7d supply, fill #0

## 2023-11-12 MED ORDER — KETOROLAC TROMETHAMINE 30 MG/ML IJ SOLN
15.0000 mg | Freq: Once | INTRAMUSCULAR | Status: AC
  Administered 2023-11-12: 15 mg via INTRAVENOUS
  Filled 2023-11-12: qty 1

## 2023-11-12 MED ORDER — IBUPROFEN 800 MG PO TABS
800.0000 mg | ORAL_TABLET | Freq: Once | ORAL | Status: AC
  Administered 2023-11-12: 800 mg via ORAL
  Filled 2023-11-12: qty 1

## 2023-11-12 MED ORDER — ONDANSETRON HCL 4 MG/2ML IJ SOLN
4.0000 mg | Freq: Once | INTRAMUSCULAR | Status: AC
  Administered 2023-11-12: 4 mg via INTRAVENOUS
  Filled 2023-11-12: qty 2

## 2023-11-12 NOTE — ED Triage Notes (Signed)
 Pt via pov from home with right flank pain x 1 week, worsening today. Pt reports that the pain makes it hard to take a deep breath, so he feels sob. Denies any urinary symptoms including hematuria, dysuria, frequency. Pt denies hx of the same, no hx of kidney stones. Pt alert & oriented, obviously uncomfortable during triage.

## 2023-11-12 NOTE — Discharge Instructions (Addendum)
 Return if any problems.

## 2023-11-12 NOTE — ED Provider Notes (Signed)
 [place] EMERGENCY DEPARTMENT AT [facility] Provider Note   CSN: [id number] Arrival date & time: 11/12/23  7628     History  Chief Complaint  Patient presents with   Flank Pain   Shortness of Breath    Kenneth Livingston is a 33 y.o. male.  Patient complains of severe low back pain for the last week.  Patient reports the pain has become more severe today.  Patient reports that he has pain with movement pain with taking a deep breath.  Patient reports he has had some nausea.  Patient denies any abdominal pain.  He states his appetite has been normal he has been eating and drinking normally.  Patient denies any injury to his back he has not had any burning with urination.  He denies any urinary frequency.  Patient has not had any past medical history he is allergic to Biaxin.  He is not currently taking any medications.  Patient denies any lifting or any straining he denies any injury to his back  The history is provided by the patient.  Flank Pain Associated symptoms include shortness of breath.  Shortness of Breath      Home Medications Prior to Admission medications   Medication Sig Start Date End Date Taking? Authorizing Provider  HYDROcodone-acetaminophen (NORCO/VICODIN) 5-325 MG tablet Take 1 tablet by mouth every 6 (six) hours as needed for severe pain. 01/28/17   Bethel Born, PA-C  ibuprofen (ADVIL,MOTRIN) 200 MG tablet Take 400 mg by mouth every 6 (six) hours as needed for headache, mild pain or moderate pain.    [provider]  naproxen (NAPROSYN) 500 MG tablet Take 1 tablet (500 mg total) by mouth 2 (two) times daily. 01/30/17   Isa Rankin, MD      Allergies    Biaxin [clarithromycin]    Review of Systems   Review of Systems  Respiratory:  Positive for shortness of breath.   Genitourinary:  Positive for flank pain.  All other systems reviewed and are negative.   Physical Exam Updated Vital Signs BP (!) 143/97 (BP Location: Right  Arm)   Pulse 88   Temp 98 F (36.7 C)   Resp 18   Ht 6' (1.829 m)   Wt 129.3 kg   SpO2 100%   BMI 38.65 kg/m  Physical Exam Vitals and nursing note reviewed.  Constitutional:      Appearance: He is well-developed.  HENT:     Head: Normocephalic.  Cardiovascular:     Rate and Rhythm: Normal rate.  Pulmonary:     Effort: Pulmonary effort is normal.     Breath sounds: Normal breath sounds.     Comments: Pain with taking a deep breath. Chest:     Chest wall: No mass or deformity.  Abdominal:     General: There is no distension.     Palpations: Abdomen is soft.     Tenderness: There is no abdominal tenderness.  Musculoskeletal:        General: Normal range of motion.     Cervical back: Normal range of motion.  Skin:    General: Skin is warm.  Neurological:     General: No focal deficit present.     Mental Status: He is alert and oriented to person, place, and time.  Psychiatric:        Mood and Affect: Mood normal.     ED Results / Procedures / Treatments   Labs (all labs ordered are listed, but  only abnormal results are displayed) Labs Reviewed  CBC - Abnormal; Notable for the following components:      Result Value   WBC 10.8 (*)    All other components within normal limits  COMPREHENSIVE METABOLIC PANEL - Abnormal; Notable for the following components:   Glucose, Bld 106 (*)    ALT 52 (*)    All other components within normal limits  LIPASE, BLOOD  D-DIMER, QUANTITATIVE  URINALYSIS, ROUTINE W REFLEX MICROSCOPIC    EKG None  Radiology No results found.  Procedures Procedures    Medications Ordered in ED Medications  ketorolac (TORADOL) 30 MG/ML injection 15 mg (has no administration in time range)  HYDROmorphone (DILAUDID) injection 1 mg (1 mg Intravenous Given 11/12/23 0937)  ondansetron (ZOFRAN) injection 4 mg (4 mg Intravenous Given 11/12/23 1610)    ED Course/ Medical Decision Making/ A&P                                 Medical Decision  Making Patient complains of severe right flank pain.  Amount and/or Complexity of Data Reviewed Labs: ordered. Decision-making details documented in ED Course.    Details: Labs ordered reviewed and interpreted.  Patient has a slightly elevated white blood cell count UA is negative chemistries are negative lipase is normal. Radiology: ordered and independent interpretation performed. Decision-making details documented in ED Course.    Details: Ct renal  no acute findings    Risk Prescription drug management.           Final Clinical Impression(s) / ED Diagnoses Final diagnoses:  Acute low back pain, unspecified back pain laterality, unspecified whether sciatica present    Rx / DC Orders ED Discharge Orders          Ordered    diclofenac (VOLTAREN) 75 MG EC tablet  2 times daily        11/12/23 1440    methocarbamol (ROBAXIN) 500 MG tablet  4 times daily        11/12/23 1440           An After Visit Summary was printed and given to the patient.    Elson Areas, PA-C 11/12/23 1448    Royanne Foots, DO 11/20/23 1424

## 2024-03-09 DIAGNOSIS — Z131 Encounter for screening for diabetes mellitus: Secondary | ICD-10-CM | POA: Diagnosis not present

## 2024-03-09 DIAGNOSIS — M25562 Pain in left knee: Secondary | ICD-10-CM | POA: Diagnosis not present

## 2024-03-09 DIAGNOSIS — E782 Mixed hyperlipidemia: Secondary | ICD-10-CM | POA: Diagnosis not present

## 2024-03-09 DIAGNOSIS — Z0000 Encounter for general adult medical examination without abnormal findings: Secondary | ICD-10-CM | POA: Diagnosis not present

## 2024-03-10 DIAGNOSIS — M25562 Pain in left knee: Secondary | ICD-10-CM | POA: Diagnosis not present

## 2024-03-13 DIAGNOSIS — M25562 Pain in left knee: Secondary | ICD-10-CM | POA: Diagnosis not present

## 2024-03-30 DIAGNOSIS — M5416 Radiculopathy, lumbar region: Secondary | ICD-10-CM | POA: Diagnosis not present

## 2024-04-12 DIAGNOSIS — M545 Low back pain, unspecified: Secondary | ICD-10-CM | POA: Diagnosis not present

## 2024-04-12 DIAGNOSIS — Z8739 Personal history of other diseases of the musculoskeletal system and connective tissue: Secondary | ICD-10-CM | POA: Diagnosis not present

## 2024-04-12 DIAGNOSIS — Z6839 Body mass index (BMI) 39.0-39.9, adult: Secondary | ICD-10-CM | POA: Diagnosis not present

## 2024-04-12 DIAGNOSIS — M79604 Pain in right leg: Secondary | ICD-10-CM | POA: Diagnosis not present

## 2024-05-07 DIAGNOSIS — M5459 Other low back pain: Secondary | ICD-10-CM | POA: Diagnosis not present

## 2024-05-22 DIAGNOSIS — M5459 Other low back pain: Secondary | ICD-10-CM | POA: Diagnosis not present

## 2024-05-31 DIAGNOSIS — M5416 Radiculopathy, lumbar region: Secondary | ICD-10-CM | POA: Diagnosis not present
